# Patient Record
Sex: Female | Born: 1945 | Race: White | Hispanic: No | Smoking: Never smoker
Health system: Southern US, Community
[De-identification: ages and names within clinical notes are randomized; demographics above are authoritative.]

## PROBLEM LIST (undated history)

## (undated) DIAGNOSIS — I10 Essential (primary) hypertension: Secondary | ICD-10-CM

## (undated) DIAGNOSIS — I272 Pulmonary hypertension, unspecified: Secondary | ICD-10-CM

## (undated) DIAGNOSIS — E119 Type 2 diabetes mellitus without complications: Secondary | ICD-10-CM

## (undated) DIAGNOSIS — I4891 Unspecified atrial fibrillation: Secondary | ICD-10-CM

## (undated) HISTORY — PX: CARDIAC SURGERY: SHX584

---

## 2015-04-06 ENCOUNTER — Encounter (HOSPITAL_COMMUNITY): Payer: Self-pay

## 2015-04-06 ENCOUNTER — Emergency Department (HOSPITAL_COMMUNITY): Payer: Medicare Other

## 2015-04-06 ENCOUNTER — Inpatient Hospital Stay (HOSPITAL_COMMUNITY)
Admission: EM | Admit: 2015-04-06 | Discharge: 2015-04-08 | DRG: 193 | Disposition: A | Discharge status: Home / Self Care | Payer: Medicare Other | Attending: Internal Medicine | Admitting: Internal Medicine

## 2015-04-06 DIAGNOSIS — I272 Other secondary pulmonary hypertension: Secondary | ICD-10-CM

## 2015-04-06 DIAGNOSIS — J189 Pneumonia, unspecified organism: Secondary | ICD-10-CM | POA: Diagnosis not present

## 2015-04-06 DIAGNOSIS — I482 Chronic atrial fibrillation, unspecified: Secondary | ICD-10-CM | POA: Diagnosis present

## 2015-04-06 DIAGNOSIS — Z6841 Body Mass Index (BMI) 40.0 and over, adult: Secondary | ICD-10-CM | POA: Diagnosis not present

## 2015-04-06 DIAGNOSIS — G8929 Other chronic pain: Secondary | ICD-10-CM | POA: Diagnosis present

## 2015-04-06 DIAGNOSIS — Z79899 Other long term (current) drug therapy: Secondary | ICD-10-CM

## 2015-04-06 DIAGNOSIS — Z880 Allergy status to penicillin: Secondary | ICD-10-CM | POA: Diagnosis not present

## 2015-04-06 DIAGNOSIS — Z7984 Long term (current) use of oral hypoglycemic drugs: Secondary | ICD-10-CM

## 2015-04-06 DIAGNOSIS — M549 Dorsalgia, unspecified: Secondary | ICD-10-CM | POA: Diagnosis present

## 2015-04-06 DIAGNOSIS — Z7901 Long term (current) use of anticoagulants: Secondary | ICD-10-CM

## 2015-04-06 DIAGNOSIS — R791 Abnormal coagulation profile: Secondary | ICD-10-CM

## 2015-04-06 DIAGNOSIS — E039 Hypothyroidism, unspecified: Secondary | ICD-10-CM | POA: Diagnosis present

## 2015-04-06 DIAGNOSIS — I10 Essential (primary) hypertension: Secondary | ICD-10-CM | POA: Diagnosis present

## 2015-04-06 DIAGNOSIS — E669 Obesity, unspecified: Secondary | ICD-10-CM | POA: Diagnosis present

## 2015-04-06 DIAGNOSIS — J9601 Acute respiratory failure with hypoxia: Secondary | ICD-10-CM | POA: Diagnosis present

## 2015-04-06 DIAGNOSIS — E119 Type 2 diabetes mellitus without complications: Secondary | ICD-10-CM | POA: Diagnosis present

## 2015-04-06 DIAGNOSIS — N179 Acute kidney failure, unspecified: Secondary | ICD-10-CM | POA: Diagnosis present

## 2015-04-06 DIAGNOSIS — I480 Paroxysmal atrial fibrillation: Secondary | ICD-10-CM

## 2015-04-06 DIAGNOSIS — I959 Hypotension, unspecified: Secondary | ICD-10-CM | POA: Diagnosis present

## 2015-04-06 DIAGNOSIS — R0602 Shortness of breath: Secondary | ICD-10-CM | POA: Diagnosis present

## 2015-04-06 HISTORY — DX: Unspecified atrial fibrillation: I48.91

## 2015-04-06 HISTORY — DX: Pulmonary hypertension, unspecified: I27.20

## 2015-04-06 HISTORY — DX: Essential (primary) hypertension: I10

## 2015-04-06 HISTORY — DX: Type 2 diabetes mellitus without complications: E11.9

## 2015-04-06 LAB — COMPREHENSIVE METABOLIC PANEL
ALBUMIN: 3.5 g/dL (ref 3.5–5.0)
ALK PHOS: 58 U/L (ref 38–126)
ALT: 21 U/L (ref 14–54)
AST: 25 U/L (ref 15–41)
Anion gap: 10 (ref 5–15)
BILIRUBIN TOTAL: 0.4 mg/dL (ref 0.3–1.2)
BUN: 29 mg/dL — AB (ref 6–20)
CALCIUM: 9 mg/dL (ref 8.9–10.3)
CO2: 29 mmol/L (ref 22–32)
CREATININE: 1.41 mg/dL — AB (ref 0.44–1.00)
Chloride: 91 mmol/L — ABNORMAL LOW (ref 101–111)
GFR calc Af Amer: 43 mL/min — ABNORMAL LOW (ref >60)
GFR, EST NON AFRICAN AMERICAN: 37 mL/min — AB (ref >60)
GLUCOSE: 217 mg/dL — AB (ref 65–99)
Potassium: 4.1 mmol/L (ref 3.5–5.1)
Sodium: 130 mmol/L — ABNORMAL LOW (ref 135–145)
TOTAL PROTEIN: 7.2 g/dL (ref 6.5–8.1)

## 2015-04-06 LAB — I-STAT CG4 LACTIC ACID, ED
Lactic Acid, Venous: 1.05 mmol/L (ref 0.5–2.0)
Lactic Acid, Venous: 0.66 mmol/L (ref 0.5–2.0)

## 2015-04-06 LAB — URINALYSIS, ROUTINE W REFLEX MICROSCOPIC
BILIRUBIN URINE: NEGATIVE
GLUCOSE, UA: NEGATIVE mg/dL
KETONES UR: NEGATIVE mg/dL
Leukocytes, UA: NEGATIVE
Nitrite: NEGATIVE
PROTEIN: 30 mg/dL — AB
Specific Gravity, Urine: 1.02 (ref 1.005–1.030)
pH: 5 (ref 5.0–8.0)

## 2015-04-06 LAB — LIPASE, BLOOD: Lipase: 49 U/L (ref 11–51)

## 2015-04-06 LAB — URINE MICROSCOPIC-ADD ON

## 2015-04-06 LAB — CBC
HCT: 39.4 % (ref 36.0–46.0)
Hemoglobin: 12.4 g/dL (ref 12.0–15.0)
MCH: 27 pg (ref 26.0–34.0)
MCHC: 31.5 g/dL (ref 30.0–36.0)
MCV: 85.8 fL (ref 78.0–100.0)
PLATELETS: 168 10*3/uL (ref 150–400)
RBC: 4.59 MIL/uL (ref 3.87–5.11)
RDW: 13.5 % (ref 11.5–15.5)
WBC: 8.2 10*3/uL (ref 4.0–10.5)

## 2015-04-06 LAB — GLUCOSE, CAPILLARY: GLUCOSE-CAPILLARY: 215 mg/dL — AB (ref 65–99)

## 2015-04-06 LAB — I-STAT TROPONIN, ED: Troponin i, poc: 0.01 ng/mL (ref 0.00–0.08)

## 2015-04-06 LAB — PROTIME-INR
INR: 4.24 — ABNORMAL HIGH (ref 0.00–1.49)
PROTHROMBIN TIME: 38.5 s — AB (ref 11.6–15.2)

## 2015-04-06 MED ORDER — SILDENAFIL CITRATE 20 MG PO TABS
20.0000 mg | ORAL_TABLET | Freq: Three times a day (TID) | ORAL | Status: DC
  Administered 2015-04-06 – 2015-04-08 (×6): 20 mg via ORAL
  Filled 2015-04-06 (×7): qty 1

## 2015-04-06 MED ORDER — INSULIN ASPART 100 UNIT/ML ~~LOC~~ SOLN
0.0000 [IU] | SUBCUTANEOUS | Status: DC
  Administered 2015-04-06: 3 [IU] via SUBCUTANEOUS
  Administered 2015-04-07: 2 [IU] via SUBCUTANEOUS
  Administered 2015-04-07: 1 [IU] via SUBCUTANEOUS

## 2015-04-06 MED ORDER — ALUM & MAG HYDROXIDE-SIMETH 200-200-20 MG/5ML PO SUSP: 30.0000 mL | Freq: Four times a day (QID) | ORAL | Status: DC | PRN

## 2015-04-06 MED ORDER — OMEGA-3-ACID ETHYL ESTERS 1 G PO CAPS
1.0000 g | ORAL_CAPSULE | Freq: Every day | ORAL | Status: DC
  Administered 2015-04-07 – 2015-04-08 (×2): 1 g via ORAL
  Filled 2015-04-06 (×2): qty 1

## 2015-04-06 MED ORDER — SODIUM CHLORIDE 0.9% FLUSH
3.0000 mL | Freq: Two times a day (BID) | INTRAVENOUS | Status: DC
  Administered 2015-04-06 – 2015-04-08 (×3): 3 mL via INTRAVENOUS

## 2015-04-06 MED ORDER — ONDANSETRON HCL 4 MG PO TABS: 4.0000 mg | ORAL_TABLET | Freq: Four times a day (QID) | ORAL | Status: DC | PRN

## 2015-04-06 MED ORDER — ONDANSETRON HCL 4 MG/2ML IJ SOLN: 4.0000 mg | Freq: Four times a day (QID) | INTRAMUSCULAR | Status: DC | PRN

## 2015-04-06 MED ORDER — THYROID 60 MG PO TABS
60.0000 mg | ORAL_TABLET | Freq: Every day | ORAL | Status: DC
  Administered 2015-04-07 – 2015-04-08 (×2): 60 mg via ORAL
  Filled 2015-04-06 (×3): qty 1

## 2015-04-06 MED ORDER — SPIRONOLACTONE 25 MG PO TABS
12.5000 mg | ORAL_TABLET | Freq: Every day | ORAL | Status: DC
  Administered 2015-04-07: 12.5 mg via ORAL
  Filled 2015-04-06: qty 1

## 2015-04-06 MED ORDER — SODIUM CHLORIDE 0.9 % IV SOLN
INTRAVENOUS | Status: DC
  Administered 2015-04-06: 21:00:00 via INTRAVENOUS

## 2015-04-06 MED ORDER — ACETAMINOPHEN 325 MG PO TABS
650.0000 mg | ORAL_TABLET | Freq: Once | ORAL | Status: DC | PRN
  Filled 2015-04-06: qty 2

## 2015-04-06 MED ORDER — ACETAMINOPHEN 650 MG RE SUPP: 650.0000 mg | Freq: Four times a day (QID) | RECTAL | Status: DC | PRN

## 2015-04-06 MED ORDER — LEVOFLOXACIN IN D5W 750 MG/150ML IV SOLN
750.0000 mg | Freq: Once | INTRAVENOUS | Status: AC
  Administered 2015-04-06: 750 mg via INTRAVENOUS
  Filled 2015-04-06: qty 150

## 2015-04-06 MED ORDER — VANCOMYCIN HCL IN DEXTROSE 1-5 GM/200ML-% IV SOLN: 1000.0000 mg | Freq: Once | INTRAVENOUS | Status: DC

## 2015-04-06 MED ORDER — METOPROLOL TARTRATE 25 MG PO TABS
12.5000 mg | ORAL_TABLET | Freq: Two times a day (BID) | ORAL | Status: DC
  Administered 2015-04-06 – 2015-04-08 (×4): 12.5 mg via ORAL
  Filled 2015-04-06 (×4): qty 1

## 2015-04-06 MED ORDER — ACETAMINOPHEN 325 MG PO TABS
650.0000 mg | ORAL_TABLET | Freq: Four times a day (QID) | ORAL | Status: DC | PRN
  Administered 2015-04-06 – 2015-04-07 (×2): 650 mg via ORAL
  Filled 2015-04-06 (×2): qty 2

## 2015-04-06 MED ORDER — OXYCODONE HCL 5 MG PO TABS
5.0000 mg | ORAL_TABLET | ORAL | Status: DC | PRN
  Administered 2015-04-07: 5 mg via ORAL
  Filled 2015-04-06: qty 1

## 2015-04-06 MED ORDER — LEVOFLOXACIN IN D5W 750 MG/150ML IV SOLN: 750.0000 mg | INTRAVENOUS | Status: DC

## 2015-04-06 MED ORDER — SODIUM CHLORIDE 0.9 % IV BOLUS (SEPSIS)
1000.0000 mL | INTRAVENOUS | Status: DC
  Administered 2015-04-06: 1000 mL via INTRAVENOUS

## 2015-04-06 MED ORDER — VANCOMYCIN HCL IN DEXTROSE 750-5 MG/150ML-% IV SOLN: 750.0000 mg | Freq: Two times a day (BID) | INTRAVENOUS | Status: DC

## 2015-04-06 MED ORDER — HYDROMORPHONE HCL 1 MG/ML IJ SOLN: 0.5000 mg | INTRAMUSCULAR | Status: DC | PRN

## 2015-04-06 NOTE — Progress Notes (Signed)
Pharmacy Antibiotic Note  Kelsey Stephens is a 70 y.o. female admitted on 04/06/2015 with sepsis.  C/o chest pain, fever and shortness of breath. Allergic to penicillins. Pharmacy has been consulted for Vancomycin and Levaquin dosing. First doses ordered in the ED. CrCl 39-39ml/min.  Plan: Vancomycin  IV q12h. Levaquin   IV q48h. Measure Vanc trough at steady state. Follow up renal fxn, culture results, and clinical course.  Height: 5' (152.4 cm) Weight: 220 lb (99.791 kg) IBW/kg (Calculated) : 45.5  Temp (24hrs), Avg:102.9 F (39.4 C), Min:102.9 F (39.4 C), Max:102.9 F (39.4 C)   Recent Labs Lab 04/06/15 1644 04/06/15 1646 04/06/15 1657  WBC 8.2  --   --   CREATININE  --  1.41*  --   LATICACIDVEN  --   --  1.05    Estimated Creatinine Clearance: 39.4 mL/min (by C-G formula based on Cr of 1.41).    Allergies  Allergen Reactions  . Ampicillin Rash    Antimicrobials this admission: 2/8 >> Vanc >>  2/8 >> Levaquin >>  Dose adjustments this admission:   Microbiology results: BCx: UCx:  Sputum: MRSA PCR:  Thank you for allowing pharmacy to be a part of this patient's care.  Charolotte Eke, PharmD, pager [phone]. 04/06/2015,5:31 PM.

## 2015-04-06 NOTE — ED Provider Notes (Signed)
CSN: [id number]     Arrival date & time 04/06/15  1605 History   First MD Initiated Contact with Patient 04/06/15 1633     Chief Complaint  Patient presents with  . Chest Pain  . Fever  . Shortness of Breath    HPI Pt started having trouble with chest pain and aching on the right side.  She also started feeling lightheaded and chilled.   The symptoms got a little better but then she started feeling worse.  She developed some abdominal cramping and diarrhea but that has resolved. She was planning a trip to [facility] from [place] and recently drove here this week.  Today she started having a fever. .  The pain in the chest continues.  It hurts for her to breathe and when she takes a deep breath she coughs.  She did not have a flu shot this year.  Past Medical History  Diagnosis Date  . Hypertension   . Atrial fibrillation (HCC)   . Diabetes mellitus without complication (HCC)   . Pulmonary hypertension [facility])    Past Surgical History  Procedure Laterality Date  . Cardiac surgery     History reviewed. No pertinent family history. Social History  Substance Use Topics  . Smoking status: Never Smoker   . Smokeless tobacco: None  . Alcohol Use: No   OB History    No data available     Review of Systems  All other systems reviewed and are negative.     Allergies  Ampicillin  Home Medications   Prior to Admission medications   Medication Sig Start Date End Date Taking? Authorizing Provider  Cholecalciferol (VITAMIN D-3) 1000 units CAPS Take 5,000 Units by mouth daily.    Yes Historical Provider, MD  furosemide (LASIX) 20 MG tablet Take 20 mg by mouth 2 (two) times daily.  03/23/15  Yes Historical Provider, MD  metFORMIN (GLUCOPHAGE) 500 MG tablet Take 500 mg by mouth 3 (three) times daily.    Yes Historical Provider, MD  metoprolol tartrate (LOPRESSOR) 25 MG tablet Take 12.5 mg by mouth 2 (two) times daily.    Yes Historical Provider, MD  Omega-3 Fatty Acids (FISH OIL) 1000 MG CAPS  Take 1 capsule by mouth daily.    Yes Historical Provider, MD  sildenafil (REVATIO) 20 MG tablet Take 20 mg by mouth 3 (three) times daily.  03/22/15  Yes Historical Provider, MD  spironolactone (ALDACTONE) 25 MG tablet Take 12.5 mg by mouth daily.   Yes Historical Provider, MD  thyroid (ARMOUR THYROID) 60 MG tablet Take 60 mg by mouth daily before breakfast.    Yes Historical Provider, MD  warfarin (COUMADIN) 5 MG tablet Take 5-7.5 mg by mouth at bedtime. Take 5 mg daily except on Monday take 7.5 mg.   Yes Historical Provider, MD   BP 119/70 mmHg  Pulse 123  Temp(Src) 99.8 F (37.7 C) (Oral)  Resp 27  Ht 5' (1.524 m)  Wt 99.791 kg  BMI 42.97 kg/m2  SpO2 90% Physical Exam  Constitutional: She appears well-developed and well-nourished. No distress.  HENT:  Head: Normocephalic and atraumatic.  Right Ear: External ear normal.  Left Ear: External ear normal.  Eyes: Conjunctivae are normal. Right eye exhibits no discharge. Left eye exhibits no discharge. No scleral icterus.  Neck: Neck supple. No tracheal deviation present.  Cardiovascular: Intact distal pulses.  An irregularly irregular rhythm present. Tachycardia present.   Pulmonary/Chest: Effort normal. No stridor. No respiratory distress. She has no wheezes.  She has rales in the right lower field and the left lower field.  Abdominal: Soft. Bowel sounds are normal. She exhibits no distension. There is no tenderness. There is no rebound and no guarding.  Musculoskeletal: She exhibits no edema or tenderness.  Neurological: She is alert. She has normal strength. No cranial nerve deficit (no facial droop, extraocular movements intact, no slurred speech) or sensory deficit. She exhibits normal muscle tone. She displays no seizure activity. Coordination normal.  Skin: Skin is warm and dry. No rash noted.  Psychiatric: She has a normal mood and affect.  Nursing note and vitals reviewed.   ED Course  Procedures (including critical care  time) Labs Review Labs Reviewed  PROTIME-INR - Abnormal; Notable for the following:    Prothrombin Time 38.5 (*)    INR 4.24 (*)    All other components within normal limits  COMPREHENSIVE METABOLIC PANEL - Abnormal; Notable for the following:    Sodium 130 (*)    Chloride 91 (*)    Glucose, Bld 217 (*)    BUN 29 (*)    Creatinine, Ser 1.41 (*)    GFR calc non Af Amer 37 (*)    GFR calc Af Amer 43 (*)    All other components within normal limits  CULTURE, BLOOD (ROUTINE X 2)  CULTURE, BLOOD (ROUTINE X 2)  URINE CULTURE  CBC  LIPASE, BLOOD  URINALYSIS, ROUTINE W REFLEX MICROSCOPIC (NOT AT [facility])  INFLUENZA PANEL BY PCR (TYPE A & B, H1N1)  I-STAT TROPOININ, ED  I-STAT CG4 LACTIC ACID, ED  I-STAT CG4 LACTIC ACID, ED    Imaging Review Dg Chest 2 View  04/06/2015  CLINICAL DATA:  Fever, shortness of breath, cough, chest discomfort EXAM: CHEST  2 VIEW COMPARISON:  None. FINDINGS: Mild patchy right lower lung opacity, overlying the heart on the lateral view, suspicious for right middle lobe pneumonia. In the absence of prior imaging, chronic scarring is also possible. No pleural effusion or pneumothorax. Cardiomegaly.  Median sternotomy. Degenerative changes of the visualized thoracolumbar spine. IMPRESSION: Mild patchy right middle lobe opacity, suspicious for pneumonia, although chronic scarring is also possible. Electronically Signed   By: Charline Bills M.D.   On: 04/06/2015 17:29   I have personally reviewed and evaluated these images and lab results as part of my medical decision-making.   EKG Interpretation   Date/Time:  Wednesday April 06 2015 16:30:39 EST Ventricular Rate:  111 PR Interval:    QRS Duration: 78 QT Interval:  301 QTC Calculation: 409 R Axis:   104 Text Interpretation:  Atrial fibrillation Right axis deviation Low  voltage, precordial leads Probable anteroseptal infarct, old No old  tracing to compare Confirmed by Jermesha Sottile  MD-J, Teran Daughenbaugh ([id number]) on 04/06/2015   5:40:51 PM     Medications  acetaminophen (TYLENOL) tablet 650 mg (not administered)  sodium chloride 0.9 % bolus 1,000 mL (1,000 mLs Intravenous New Bag/Given 04/06/15 1759)  levofloxacin (LEVAQUIN) IVPB 750 mg (750 mg Intravenous New Bag/Given 04/06/15 1759)  vancomycin (VANCOCIN) IVPB 1000 mg/200 mL premix (not administered)  levofloxacin (LEVAQUIN) IVPB 750 mg (not administered)  vancomycin (VANCOCIN) IVPB 750 mg/150 ml premix (not administered)    MDM   Final diagnoses:  CAP (community acquired pneumonia)    Patient's chest x-ray is suggestive of a right middle lobe pneumonia. The patient remains tachycardic and tachypnea. She is mildly hypoxic.  Laboratory tests are notable for mild renal sufficiency and hypokalemia although she has a normal lactic acid level.  He does not have evidence of severe sepsis or septic shock. I do think she would benefit from hospitalization (PORT score 110) considering her persistent tachycardia and tachypnea and her laboratory abnormalities.      Linwood Dibbles, MD 04/06/15 [phone]

## 2015-04-06 NOTE — ED Notes (Addendum)
Pt c/o generalized chest pressure w/ deep breathing, SOB, chills, lightheadedness, and emesis x 5 days and abdominal pain and diarrhea x "a couple days."  Pain score 4/10.  Hx of A fib, HTN and diabetes.  Pt reports she recently travelled by car from [place] and arrived today.

## 2015-04-06 NOTE — H&P (Addendum)
[facility] Admission History and Physical       Khrystal Jeanmarie NWG:[id number] DOB: [birth year] DOA: 04/06/2015  Referring physician: EDP PCP: No primary care provider on file.  Specialists:   Chief Complaint: SOB and Cough and Fever  HPI: Kelsey Stephens is a 70 y.o. female with a history of Atrial Fibrillation on Coumadin Rx, PAH, HTN, DM2 and Hypothyroid who presents to the ED with complaints of worsening SOB, Cough, and Fevers and Chills over a 1 week period.   She was traveling by car to [place] from [place] to visit family and was brought to the ED today.   She was evaluated in the ED for Sepsis initially then she was found to have a RML infiltrate and was referred for admission for a CAP Pneumonia.    She was placed on IV Levaquin .     Review of Systems:  Constitutional: No Weight Loss, No Weight Gain, Night Sweats, +Fevers, +Chills, Dizziness, Light Headedness, Fatigue, or Generalized Weakness HEENT: No Headaches, Difficulty Swallowing,Tooth/Dental Problems,Sore Throat,  No Sneezing, Rhinitis, Ear Ache, Nasal Congestion, or Post Nasal Drip,  Cardio-vascular:  No Chest pain, Orthopnea, PND, Edema in Lower Extremities, Anasarca, Dizziness, Palpitations  Resp:  +Dyspnea, No DOE, No Productive Cough, +Non-Productive Cough, No Hemoptysis, No Wheezing.    GI: No Heartburn, Indigestion, Abdominal Pain, Nausea, Vomiting, Diarrhea, Constipation, Hematemesis, Hematochezia, Melena, Change in Bowel Habits,  Loss of Appetite  GU: No Dysuria, No Change in Color of Urine, No Urgency or Urinary Frequency, No Flank pain.  Musculoskeletal: No Joint Pain or Swelling, No Decreased Range of Motion, No Back Pain.  Neurologic: No Syncope, No Seizures, Muscle Weakness, Paresthesia, Vision Disturbance or Loss, No Diplopia, No Vertigo, No Difficulty Walking,  Skin: No Rash or Lesions. Psych: No Change in Mood or Affect, No Depression or Anxiety, No Memory loss, No Confusion, or Hallucinations   Past  Medical History  Diagnosis Date  . Hypertension   . Atrial fibrillation (HCC)   . Diabetes mellitus without complication (HCC)   . Pulmonary hypertension [facility])      Past Surgical History  Procedure Laterality Date  . Cardiac surgery        Prior to Admission medications   Medication Sig Start Date End Date Taking? Authorizing Provider  Cholecalciferol (VITAMIN D-3) 1000 units CAPS Take 5,000 Units by mouth daily.    Yes Historical Provider, MD  furosemide (LASIX) 20 MG tablet Take 20 mg by mouth 2 (two) times daily.  03/23/15  Yes Historical Provider, MD  metFORMIN (GLUCOPHAGE) 500 MG tablet Take 500 mg by mouth 3 (three) times daily.    Yes Historical Provider, MD  metoprolol tartrate (LOPRESSOR) 25 MG tablet Take 12.5 mg by mouth 2 (two) times daily.    Yes Historical Provider, MD  Omega-3 Fatty Acids (FISH OIL) 1000 MG CAPS Take 1 capsule by mouth daily.    Yes Historical Provider, MD  sildenafil (REVATIO) 20 MG tablet Take 20 mg by mouth 3 (three) times daily.  03/22/15  Yes Historical Provider, MD  spironolactone (ALDACTONE) 25 MG tablet Take 12.5 mg by mouth daily.   Yes Historical Provider, MD  thyroid (ARMOUR THYROID) 60 MG tablet Take 60 mg by mouth daily before breakfast.    Yes Historical Provider, MD  warfarin (COUMADIN) 5 MG tablet Take 5-7.5 mg by mouth at bedtime. Take 5 mg daily except on Monday take 7.5 mg.   Yes Historical Provider, MD     Allergies  Allergen Reactions  .  Ampicillin Rash     Social History:  reports that she has never smoked. She does not have any smokeless tobacco history on file. She reports that she does not drink alcohol. Her drug history is not on file.      History reviewed. No pertinent family history.     Physical Exam:  GEN:  Pleasant Obese Elderly 70 y.o. Caucasian female examined and in no acute distress; cooperative with exam Filed Vitals:   04/06/15 1624 04/06/15 1655 04/06/15 1733 04/06/15 1759  BP: 136/81  119/70 119/70    Pulse: 122  120 123  Temp: 102.9 F (39.4 C)   99.8 F (37.7 C)  TempSrc: Oral   Oral  Resp: 20  34 27  Height:  5' (1.524 m)    Weight:  99.791 kg (220 lb)    SpO2: 92%  90% 90%   Blood pressure 119/70, pulse 123, temperature 99.8 F (37.7 C), temperature source Oral, resp. rate 27, height 5' (1.524 m), weight 99.791 kg (220 lb), SpO2 90 %. PSYCH: She is alert and oriented x4; does not appear anxious does not appear depressed; affect is normal HEENT: Normocephalic and Atraumatic, Mucous membranes pink; PERRLA; EOM intact; Fundi:  Benign;  No scleral icterus, Nares: Patent, Oropharynx: Clear, Fair Dentition,    Neck:  FROM, No Cervical Lymphadenopathy nor Thyromegaly or Carotid Bruit; No JVD; Breasts:: Not examined CHEST WALL: No tenderness CHEST: Normal respiration, clear to auscultation bilaterally HEART: Irregular Irregular rhythm; no murmurs rubs or gallops BACK: No kyphosis or scoliosis; No CVA tenderness ABDOMEN: Positive Bowel Sounds, Obese, Soft Non-Tender, No Rebound or Guarding; No Masses, No Organomegaly Rectal Exam: Not done EXTREMITIES: No Cyanosis, Clubbing, or Edema; No Ulcerations. Genitalia: not examined PULSES: 2+ and symmetric SKIN: Normal hydration no rash or ulceration CNS:  Alert and Oriented x 4, No Focal Deficits Vascular: pulses palpable throughout    Labs on Admission:  Basic Metabolic Panel:  Recent Labs Lab 04/06/15 1646  NA 130*  K 4.1  CL 91*  CO2 29  GLUCOSE 217*  BUN 29*  CREATININE 1.41*  CALCIUM 9.0   Liver Function Tests:  Recent Labs Lab 04/06/15 1646  AST 25  ALT 21  ALKPHOS 58  BILITOT 0.4  PROT 7.2  ALBUMIN 3.5    Recent Labs Lab 04/06/15 1644  LIPASE 49   No results for input(s): AMMONIA in the last 168 hours. CBC:  Recent Labs Lab 04/06/15 1644  WBC 8.2  HGB 12.4  HCT 39.4  MCV 85.8  PLT 168   Cardiac Enzymes: No results for input(s): CKTOTAL, CKMB, CKMBINDEX, TROPONINI in the last 168 hours.  BNP  (last 3 results) No results for input(s): BNP in the last 8760 hours.  ProBNP (last 3 results) No results for input(s): PROBNP in the last 8760 hours.  CBG: No results for input(s): GLUCAP in the last 168 hours.  Radiological Exams on Admission: Dg Chest 2 View  04/06/2015  CLINICAL DATA:  Fever, shortness of breath, cough, chest discomfort EXAM: CHEST  2 VIEW COMPARISON:  None. FINDINGS: Mild patchy right lower lung opacity, overlying the heart on the lateral view, suspicious for right middle lobe pneumonia. In the absence of prior imaging, chronic scarring is also possible. No pleural effusion or pneumothorax. Cardiomegaly.  Median sternotomy. Degenerative changes of the visualized thoracolumbar spine. IMPRESSION: Mild patchy right middle lobe opacity, suspicious for pneumonia, although chronic scarring is also possible. Electronically Signed   By: Roselie Awkward.D.  On: 04/06/2015 17:29     EKG: Independently reviewed.    Assessment/Plan:   70 y.o. female with  Principal Problem:    1.   CAP (community acquired pneumonia)    IV Levaquin    Albuterol Nebs PRN    O2 PRN       Active Problems:    2.   Atrial fibrillation (HCC)    Continue Lopressor or as BP and HR tolerate    Continue Coumadin with Pharmacy adjustment PRN     Daily PT/INRs    3.   AKI   Hold Lasix   Gentle IVFs   Monitor BUN/Cr      4.   Hypertension    Continue Lopressor and Aldactone Rx    Hold Lasix Rx due to #3    Monitor BMET      4.   Pulmonary hypertension (HCC)    Continue Revatio Rx    Patient may take her Home dose of Opsumit qhs daily      5.   Diabetes mellitus without complication (HCC)    Hold Metformin Rx    SSI coverage PRN    Check HbA1C      6.   DVT Prophylaxis    On Coumadin Rx          Code Status:     FULL CODE      Family Communication:   Family at Bedside   Disposition Plan:    Inpatient Status Expected 2-3 days LOS         Time spent:  11 Minutes       Ron Parker [facility] Pager [phone]   If 7AM -7PM Please Contact the Day Rounding Team MD for [facility]  If 7PM-7AM, Please Contact Night-Floor Coverage  [email or web address] Password [facility] 04/06/2015, 6:49 PM     ADDENDUM:   Patient was seen and examined on 04/06/2015

## 2015-04-07 DIAGNOSIS — N179 Acute kidney failure, unspecified: Secondary | ICD-10-CM

## 2015-04-07 LAB — INFLUENZA PANEL BY PCR (TYPE A & B)
H1N1FLUPCR: NOT DETECTED
Influenza A By PCR: NEGATIVE
Influenza B By PCR: NEGATIVE

## 2015-04-07 LAB — CBC
HEMATOCRIT: 34.9 % — AB (ref 36.0–46.0)
HEMOGLOBIN: 10.9 g/dL — AB (ref 12.0–15.0)
MCH: 26.8 pg (ref 26.0–34.0)
MCHC: 31.2 g/dL (ref 30.0–36.0)
MCV: 86 fL (ref 78.0–100.0)
Platelets: 166 10*3/uL (ref 150–400)
RBC: 4.06 MIL/uL (ref 3.87–5.11)
RDW: 13.4 % (ref 11.5–15.5)
WBC: 7.5 10*3/uL (ref 4.0–10.5)

## 2015-04-07 LAB — GLUCOSE, CAPILLARY
GLUCOSE-CAPILLARY: 104 mg/dL — AB (ref 65–99)
GLUCOSE-CAPILLARY: 143 mg/dL — AB (ref 65–99)
GLUCOSE-CAPILLARY: 151 mg/dL — AB (ref 65–99)
GLUCOSE-CAPILLARY: 163 mg/dL — AB (ref 65–99)
GLUCOSE-CAPILLARY: 179 mg/dL — AB (ref 65–99)
Glucose-Capillary: 107 mg/dL — ABNORMAL HIGH (ref 65–99)

## 2015-04-07 LAB — PROTIME-INR
INR: 5.61 (ref 0.00–1.49)
PROTHROMBIN TIME: 47.5 s — AB (ref 11.6–15.2)

## 2015-04-07 LAB — BASIC METABOLIC PANEL
ANION GAP: 7 (ref 5–15)
BUN: 25 mg/dL — AB (ref 6–20)
CALCIUM: 8.5 mg/dL — AB (ref 8.9–10.3)
CO2: 28 mmol/L (ref 22–32)
Chloride: 100 mmol/L — ABNORMAL LOW (ref 101–111)
Creatinine, Ser: 1.26 mg/dL — ABNORMAL HIGH (ref 0.44–1.00)
GFR calc Af Amer: 49 mL/min — ABNORMAL LOW (ref >60)
GFR calc non Af Amer: 42 mL/min — ABNORMAL LOW (ref >60)
GLUCOSE: 132 mg/dL — AB (ref 65–99)
Potassium: 3.9 mmol/L (ref 3.5–5.1)
Sodium: 135 mmol/L (ref 135–145)

## 2015-04-07 LAB — STREP PNEUMONIAE URINARY ANTIGEN: STREP PNEUMO URINARY ANTIGEN: NEGATIVE

## 2015-04-07 MED ORDER — DEXTROSE 5 % IV SOLN
1.0000 g | INTRAVENOUS | Status: DC
  Administered 2015-04-07 – 2015-04-08 (×2): 1 g via INTRAVENOUS
  Filled 2015-04-07 (×2): qty 10

## 2015-04-07 MED ORDER — DIPHENHYDRAMINE HCL 25 MG PO CAPS: 25.0000 mg | ORAL_CAPSULE | Freq: Four times a day (QID) | ORAL | Status: DC | PRN

## 2015-04-07 MED ORDER — INSULIN ASPART 100 UNIT/ML ~~LOC~~ SOLN
0.0000 [IU] | Freq: Three times a day (TID) | SUBCUTANEOUS | Status: DC
  Administered 2015-04-08 (×2): 2 [IU] via SUBCUTANEOUS

## 2015-04-07 MED ORDER — PRO-STAT SUGAR FREE PO LIQD
30.0000 mL | Freq: Every day | ORAL | Status: DC
  Administered 2015-04-07 – 2015-04-08 (×2): 30 mL via ORAL
  Filled 2015-04-07 (×2): qty 30

## 2015-04-07 MED ORDER — PHYTONADIONE 5 MG PO TABS
2.5000 mg | ORAL_TABLET | Freq: Once | ORAL | Status: AC
  Administered 2015-04-07: 2.5 mg via ORAL
  Filled 2015-04-07: qty 1

## 2015-04-07 MED ORDER — MUSCLE RUB 10-15 % EX CREA
1.0000 "application " | TOPICAL_CREAM | CUTANEOUS | Status: DC | PRN
  Administered 2015-04-07: 1 via TOPICAL
  Filled 2015-04-07: qty 85

## 2015-04-07 MED ORDER — OXYCODONE HCL 5 MG PO TABS
5.0000 mg | ORAL_TABLET | ORAL | Status: DC | PRN
  Administered 2015-04-07 – 2015-04-08 (×3): 5 mg via ORAL
  Filled 2015-04-07 (×3): qty 1

## 2015-04-07 NOTE — Progress Notes (Signed)
Initial Nutrition Assessment  DOCUMENTATION CODES:   Morbid obesity  INTERVENTION:  - Will order 30 mL Prostat once/day, this supplement provides 100 kcal and 15 grams of protein. - Continue to encourage PO intakes of meals - RD will continue to monitor for needs  NUTRITION DIAGNOSIS:   Inadequate oral intake related to acute illness, nausea, vomiting as evidenced by per patient/family report.  GOAL:   Patient will meet greater than or equal to 90% of their needs  MONITOR:   PO intake, Weight trends, Labs, I & O's  REASON FOR ASSESSMENT:   Malnutrition Screening Tool  ASSESSMENT:   70 y.o. female with a history of Atrial Fibrillation on Coumadin Rx, PAH, HTN, DM2 and Hypothyroid who presents to the ED with complaints of worsening SOB, Cough, and Fevers and Chills over a 1 week period. She was traveling by car to [place] from [place] to visit family and was brought to the ED today. She was evaluated in the ED for Sepsis initially then she was found to have a RML infiltrate and was referred for admission for a CAP Pneumonia.  Pt seen for MST. BMI indicates morbid obesity. Per chart review, pt consumed 100% of lunch today. Pt reports that for breakfast she consumed ~50% of an egg white omelet, breakfast potatoes, orange juice, and milk. Pt reports that she is very exhausted today due to lack of sleeping last night due to sounds present throughout the night.   Pt states that on Saturday (2/4) she ate scrambled eggs for dinner after work. She states that on 2/4 she began to feel lethargic and weak but after several hours of sleeping felt better. She states that on Monday (2/6) she left [place] to drive to [place] and has felt progressively worse since that time. She states minimal solid food over the past 3 days but that she was frequently drinking orange juice and water. Pt states that she has had diarrhea x1-2 days PTA but has not experienced any episodes today. She states  nausea and associated vomiting over the past few days.   Pt states that her UBW is 217 lbs but that over the past 2 months she has gained 3 lbs. Chart review indicates pt now at UBW which indicates 3 lb weight loss (1% body weight loss) over the past <1 week. No signs of muscle or fat wasting present.   She was not meeting needs for several days PTA. Will order Prostat to supplement protein intake due to increased protein needs related to PNA. Medications reviewed. Labs reviewed; CBGs: 107-215 mg/dL, Cl: 413 mmol/L, BUN/creatinine elevated, Ca: 8.5 mg/dL, GFR: 42.   Diet Order:  Diet heart healthy/carb modified Room service appropriate?: Yes; Fluid consistency:: Thin  Skin:  Reviewed, no issues  Last BM:  2/8  Height:   Ht Readings from Last 1 Encounters:  04/06/15 5' (1.524 m)    Weight:   Wt Readings from Last 1 Encounters:  04/06/15 217 lb 6.4 oz (98.612 kg)    Ideal Body Weight:  45.45 kg (kg)  BMI:  Body mass index is 42.46 kg/(m^2).  Estimated Nutritional Needs:   Kcal:  1500-1700  Protein:  85-95 grams  Fluid:  2-2.3 L/day  EDUCATION NEEDS:   No education needs identified at this time     Trenton Gammon, RD, LDN Inpatient Clinical Dietitian Pager # [phone] After hours/weekend pager # [phone]

## 2015-04-07 NOTE — Care Management Note (Signed)
Case Management Note  Patient Details  Name: Shannan Slinker MRN: [id number] Date of Birth: [birth year]  Subjective/Objective:  70 y/o f admitted w/PNA. From out of town.                  Action/Plan:d/c plan home.   Expected Discharge Date:   (unknown)               Expected Discharge Plan:  Home/Self Care  In-House Referral:     Discharge planning Services  CM Consult  Post Acute Care Choice:    Choice offered to:     DME Arranged:    DME Agency:     HH Arranged:    HH Agency:     Status of Service:  In process, will continue to follow  Medicare Important Message Given:    Date Medicare IM Given:    Medicare IM give by:    Date Additional Medicare IM Given:    Additional Medicare Important Message give by:     If discussed at Long Length of Stay Meetings, dates discussed:    Additional Comments:  Lanier Clam, RN 04/07/2015, 2:24 PM

## 2015-04-07 NOTE — Progress Notes (Signed)
[facility] PROGRESS NOTE  Chandy Tarman JXB:[id number] DOB: 1945-12-25 DOA: 04/06/2015 PCP: No primary care provider on file.  Brief Summary  Kelsey Stephens is a 70 y.o. female with a history of Atrial Fibrillation on Coumadin Rx, PAH, HTN, DM2 and Hypothyroid who presents to the ED with complaints of worsening SOB, Cough, and Fevers and Chills over a 1 week period. She was traveling by car to [place] from [place] to visit family and was brought to the ED today. She was evaluated in the ED for Sepsis initially then she was found to have a RML infiltrate and was referred for admission for a CAP Pneumonia. She was placed on IV Levaquin .   Assessment/Plan  Acute hypoxic respiratory failure secondary to community-acquired pneumonia -  Discontinue levofloxacin secondary to elevated INR -  Start doxycycline instead -  Influenza negative -  Strep pneumo antigen negative -  Attention to transition to room air if possible -  Ambulatory pulse oximetry tomorrow  Chronic atrial fibrillation, rate controlled -  CHADs2vasc = 4, needs anticoagulation and has been fairly stable on warfarin for a while -  Elevated INR -  Give 1 dose of vitamin K because patient took her dose of warfarin last night and was given a dose of levofloxacin -  No evidence of bleeding -  Pharmacy to assist with dosing of Coumadin -  We will discuss with case manager options for INR check was discharge since the patient is from [place] and has no primary care doctor here  Acute kidney injury, creatinine trending down with IV fluids  Hypertension, mild hypotension -  Discontinue Aldactone -  Agree with hold parameters for beta blocker  Pulmonary hypertension, stable, continue Revatio  Diabetes mellitus type 2, stable -  Continue SSI -  F/u A1c -  Hold metformin   Diet:  Diabetic Access:  PIV IVF:  Off Proph:  Therapeutic warfarin  Code Status: Full code Family Communication: Patient  alone Disposition Plan: Pending improvement in INR, plan for outpatient follow-up, improvement in dyspnea with ambulatory pulse ox in the morning   Consultants:  None  Procedures:  Chest x-ray  Antibiotics:  Levofloxacin 2/8, doxycycline 2/9   HPI/Subjective:  Patient states that she continues to have a cough but not much wheezing. She does not currently have fevers or chills. She has some mild pleuritic chest pain. She denies any obvious   Ob bruising or bleeding. She denies black tarry stools or blood in her stools.jectiveCeasar Mons Vitals:   04/07/15 0055 04/07/15 0605 04/07/15 0804 04/07/15 0940  BP: 93/56 103/54 110/55 109/86  Pulse: 85 88 89 104  Temp: 98.3 F (36.8 C) 98.3 F (36.8 C) 98.3 F (36.8 C)   TempSrc: Oral Oral Oral   Resp: Height:      Weight:      SpO2: 95% 98% 98% 92%    Intake/Output Summary (Last 24 hours) at 04/07/15 1825 Last data filed at 04/07/15 1700  Gross per 24 hour  Intake    240 ml  Output   1000 ml  Net   -760 ml   Filed Weights   04/06/15 1655 04/06/15 2001  Weight: 99.791 kg (220 lb) 98.612 kg (217 lb 6.4 oz)   Body mass index is 42.46 kg/(m^2).  Exam:   General:   adult female, No acute distress  HEENT:  NCAT, MMM  Cardiovascular:  RRR, nl S1, S2 no mrg, 2+ pulses, warm extremities  RespFaint wheeze, diminished  in the right upper lobe , no increased WOB  Abdomen:   NABS, soft, NT/ND  MSK:   Normal tone and bulk, no LEE  Neuro:  Grossly intact  Data Reviewed: Basic Metabolic Panel:  Recent Labs Lab 04/06/15 1646 04/07/15 0459  NA 130* 135  K 4.1 3.9  CL 91* 100*  CO2 29 28  GLUCOSE 217* 132*  BUN 29* 25*  CREATININE 1.41* 1.26*  CALCIUM 9.0 8.5*   Liver Function Tests:  Recent Labs Lab 04/06/15 1646  AST 25  ALT 21  ALKPHOS 58  BILITOT 0.4  PROT 7.2  ALBUMIN 3.5    Recent Labs Lab 04/06/15 1644  LIPASE 49   No results for input(s): AMMONIA in the last 168  hours. CBC:  Recent Labs Lab 04/06/15 1644 04/07/15 0459  WBC 8.2 7.5  HGB 12.4 10.9*  HCT 39.4 34.9*  MCV 85.8 86.0  PLT 168 166    Recent Results (from the past 240 hour(s))  Culture, blood (routine x 2)     Status: None (Preliminary result)   Collection Time: 04/06/15  4:44 PM  Result Value Ref Range Status   Specimen Description BLOOD RIGHT ANTECUBITAL  Final   Special Requests BOTTLES DRAWN AEROBIC AND ANAEROBIC 5CC  Final   Culture   Final    NO GROWTH < 24 HOURS Performed at [facility]    Report Status PENDING  Incomplete  Culture, blood (routine x 2)     Status: None (Preliminary result)   Collection Time: 04/06/15  4:44 PM  Result Value Ref Range Status   Specimen Description BLOOD LEFT FOREARM  Final   Special Requests BOTTLES DRAWN AEROBIC ONLY 5CC  Final   Culture   Final    NO GROWTH < 24 HOURS Performed at [facility]    Report Status PENDING  Incomplete  Urine culture     Status: None (Preliminary result)   Collection Time: 04/06/15  6:02 PM  Result Value Ref Range Status   Specimen Description URINE, CLEAN CATCH  Final   Special Requests NONE  Final   Culture   Final    TOO YOUNG TO READ Performed at [facility]    Report Status PENDING  Incomplete     Studies: Dg Chest 2 View  04/06/2015  CLINICAL DATA:  Fever, shortness of breath, cough, chest discomfort EXAM: CHEST  2 VIEW COMPARISON:  None. FINDINGS: Mild patchy right lower lung opacity, overlying the heart on the lateral view, suspicious for right middle lobe pneumonia. In the absence of prior imaging, chronic scarring is also possible. No pleural effusion or pneumothorax. Cardiomegaly.  Median sternotomy. Degenerative changes of the visualized thoracolumbar spine. IMPRESSION: Mild patchy right middle lobe opacity, suspicious for pneumonia, although chronic scarring is also possible. Electronically Signed   By: Charline Bills M.D.   On: 04/06/2015 17:29    Scheduled  Meds: . cefTRIAXone (ROCEPHIN)  IV  1 g Intravenous Q24H  . feeding supplement (PRO-STAT SUGAR FREE 64)  30 mL Oral Daily  . insulin aspart  0-9 Units Subcutaneous 6 times per day  . metoprolol tartrate  12.5 mg Oral BID  . omega-3 acid ethyl esters  1 g Oral Daily  . sildenafil  20 mg Oral TID  . sodium chloride flush  3 mL Intravenous Q12H  . spironolactone  12.5 mg Oral Daily  . thyroid  60 mg Oral QAC breakfast   Continuous Infusions:   Principal Problem:  CAP (community acquired pneumonia) Active Problems:   Atrial fibrillation (HCC)   Hypertension   Pulmonary hypertension (HCC)   Diabetes mellitus without complication (HCC)   AKI (acute kidney injury) (HCC)    Time spent: 30 min    Tolbert Matheson  [facility] Pager [phone]. If 7PM-7AM, please contact night-coverage at [email or web address], password [facility] 04/07/2015, 6:25 PM  LOS: 1 day

## 2015-04-07 NOTE — Clinical Documentation Improvement (Signed)
Internal Medicine  Can the diagnosis  Be provided for patient's BMI of 42.97 ? Thank you     Identify Type - morbid obesity, obesity (link the BMI to condition e.g. morbid obesity with BMI of 47), overweight, with alveolar hypoventilation (Pickwickian Syndrome)  Document etiology of - drug induced (specify drug), due to excess calories, familial, endocrine  Other  Clinically Undetermined  Document any associated diagnoses/conditions.     Please exercise your independent, professional judgment when responding. A specific answer is not anticipated or expected.   Thank You,  Lavonda Jumbo Health Information Management [place] [phone]

## 2015-04-07 NOTE — Progress Notes (Signed)
ANTICOAGULATION CONSULT NOTE - Initial Consult  Pharmacy Consult for Warfarin Indication: atrial fibrillation  Allergies  Allergen Reactions  . Ampicillin Rash    Patient Measurements: Height: 5' (152.4 cm) Weight: 217 lb 6.4 oz (98.612 kg) IBW/kg (Calculated) : 45.5  Vital Signs: Temp: 98.3 F (36.8 C) (02/09 0804) Temp Source: Oral (02/09 0804) BP: 109/86 mmHg (02/09 0940) Pulse Rate: 104 (02/09 0940)  Labs:  Recent Labs  04/06/15 1644 04/06/15 1646 04/07/15 0459  HGB 12.4  --  10.9*  HCT 39.4  --  34.9*  PLT 168  --  166  LABPROT 38.5*  --  47.5*  INR 4.24*  --  5.61*  CREATININE  --  1.41* 1.26*    Estimated Creatinine Clearance: 43.7 mL/min (by C-G formula based on Cr of 1.26).  Medical History: Past Medical History  Diagnosis Date  . Hypertension   . Atrial fibrillation (HCC)   . Diabetes mellitus without complication (HCC)   . Pulmonary hypertension [facility])     Assessment: 70 y.o. female admitted on 04/06/2015 with sepsis, CAP.  PMH also includes chronic warfarin anticoagulation for Afib.  Her INR on admission was elevated and is now increased further following Levaquin dose on 2/8.  Today, pharmacy is consulted to resume warfarin dosing when INR normalizes. Home dose is reported as  daily except 7.5mg  on Mondays, last taken on 2/7.  Today, 04/07/2015:  INR supratherapeutic and increased to 5.61  CBC: Hgb down to 10.9, Plt 166  Diet: heart healthy, carb modified.  Drug-drug interactions:  Levaquin (one dose given 2/8) will increase INR; antibiotic now changed to ceftriaxone which is less likely to cause warfarin interaction.  Vitamin K 2.5mg  PO once 2/9.    Goal of Therapy:  INR 2-3 Monitor platelets by anticoagulation protocol: Yes   Plan:   Hold warfarin today  Pharmacy to f/u daily INR  Lynann Beaver PharmD, BCPS Pager [phone] 04/07/2015 10:39 AM

## 2015-04-07 NOTE — Clinical Documentation Improvement (Signed)
Internal Medicine  Can the diagnosis of Atrial Fibrillation be further specified? Thank you    Chronic Atrial fibrillation  Paroxysmal Atrial fibrillation  Permanent Atrial fibrillation  Persistent Atrial fibrillation  Other  Clinically Undetermined  Document any associated diagnoses/conditions.     Please exercise your independent, professional judgment when responding. A specific answer is not anticipated or expected.   Thank You,  Iram Lundberg J Ameliya Nicotra Health Information Management [place] [phone] 

## 2015-04-08 ENCOUNTER — Inpatient Hospital Stay (HOSPITAL_COMMUNITY): Payer: Medicare Other

## 2015-04-08 DIAGNOSIS — E119 Type 2 diabetes mellitus without complications: Secondary | ICD-10-CM

## 2015-04-08 DIAGNOSIS — I10 Essential (primary) hypertension: Secondary | ICD-10-CM

## 2015-04-08 DIAGNOSIS — I482 Chronic atrial fibrillation: Secondary | ICD-10-CM

## 2015-04-08 DIAGNOSIS — J189 Pneumonia, unspecified organism: Principal | ICD-10-CM

## 2015-04-08 LAB — GLUCOSE, CAPILLARY
GLUCOSE-CAPILLARY: 160 mg/dL — AB (ref 65–99)
GLUCOSE-CAPILLARY: 115 mg/dL — AB (ref 65–99)
Glucose-Capillary: 193 mg/dL — ABNORMAL HIGH (ref 65–99)

## 2015-04-08 LAB — BASIC METABOLIC PANEL
ANION GAP: 8 (ref 5–15)
BUN: 24 mg/dL — ABNORMAL HIGH (ref 6–20)
CHLORIDE: 100 mmol/L — AB (ref 101–111)
CO2: 28 mmol/L (ref 22–32)
Calcium: 9.3 mg/dL (ref 8.9–10.3)
Creatinine, Ser: 1.17 mg/dL — ABNORMAL HIGH (ref 0.44–1.00)
GFR calc non Af Amer: 46 mL/min — ABNORMAL LOW (ref >60)
GFR, EST AFRICAN AMERICAN: 53 mL/min — AB (ref >60)
Glucose, Bld: 190 mg/dL — ABNORMAL HIGH (ref 65–99)
Potassium: 4.4 mmol/L (ref 3.5–5.1)
Sodium: 136 mmol/L (ref 135–145)

## 2015-04-08 LAB — URINE CULTURE

## 2015-04-08 LAB — CBC
HCT: 36.4 % (ref 36.0–46.0)
HEMOGLOBIN: 11.2 g/dL — AB (ref 12.0–15.0)
MCH: 26.9 pg (ref 26.0–34.0)
MCHC: 30.8 g/dL (ref 30.0–36.0)
MCV: 87.5 fL (ref 78.0–100.0)
Platelets: 180 10*3/uL (ref 150–400)
RBC: 4.16 MIL/uL (ref 3.87–5.11)
RDW: 13.5 % (ref 11.5–15.5)
WBC: 7 10*3/uL (ref 4.0–10.5)

## 2015-04-08 LAB — PROTIME-INR
INR: 2.76 — ABNORMAL HIGH (ref 0.00–1.49)
Prothrombin Time: 27.9 seconds — ABNORMAL HIGH (ref 11.6–15.2)

## 2015-04-08 LAB — LEGIONELLA ANTIGEN, URINE

## 2015-04-08 LAB — HEMOGLOBIN A1C
Hgb A1c MFr Bld: 7 % — ABNORMAL HIGH (ref 4.8–5.6)
Mean Plasma Glucose: 154 mg/dL

## 2015-04-08 MED ORDER — WARFARIN - PHARMACIST DOSING INPATIENT: Freq: Every day | Status: DC

## 2015-04-08 MED ORDER — CEFUROXIME AXETIL 500 MG PO TABS: 500.0000 mg | ORAL_TABLET | Freq: Two times a day (BID) | ORAL | Status: AC

## 2015-04-08 MED ORDER — WARFARIN SODIUM 5 MG PO TABS
5.0000 mg | ORAL_TABLET | Freq: Once | ORAL | Status: AC
  Administered 2015-04-08: 5 mg via ORAL
  Filled 2015-04-08: qty 1

## 2015-04-08 MED ORDER — MUSCLE RUB 10-15 % EX CREA: 1.0000 "application " | TOPICAL_CREAM | CUTANEOUS | Status: AC | PRN

## 2015-04-08 MED ORDER — CETYLPYRIDINIUM CHLORIDE 0.05 % MT LIQD
7.0000 mL | Freq: Two times a day (BID) | OROMUCOSAL | Status: DC
  Administered 2015-04-08: 7 mL via OROMUCOSAL

## 2015-04-08 MED ORDER — OXYCODONE-ACETAMINOPHEN 5-325 MG PO TABS: 1.0000 | ORAL_TABLET | ORAL | Status: AC | PRN

## 2015-04-08 NOTE — Discharge Summary (Addendum)
Physician Discharge Summary  Takasha Vetere ZOX:[id number] DOB: [birth year] DOA: 04/06/2015  PCP: No primary care provider on file.  Admit date: 04/06/2015 Discharge date: 04/08/2015  Recommendations for Outpatient Follow-up:  1. F/u for INR check on Monday or Tuesday in [place] 2. If back pain not improving, recommend follow up with PCP for additional testing  Discharge Diagnoses:  Principal Problem:   CAP (community acquired pneumonia) Active Problems:   Chronic atrial fibrillation (HCC)   Hypertension   Pulmonary hypertension (HCC)   Diabetes mellitus without complication (HCC)   AKI (acute kidney injury) (HCC)   Discharge Condition: stable, improved  Diet recommendation: diabetic diet  Wt Readings from Last 3 Encounters:  04/06/15 98.612 kg (217 lb 6.4 oz)    History of present illness:   Kelsey Stephens is a 70 y.o. female with a history of Atrial Fibrillation on Coumadin Rx, PAH, HTN, DM2 and Hypothyroid who presents to the ED with complaints of worsening SOB, Cough, and Fevers and Chills over a 1 week period. She was traveling by car to [place] from [place] to visit family and was brought to the ED today. She was evaluated in the ED for Sepsis initially then she was found to have a RML infiltrate and was referred for admission for a CAP Pneumonia. She was placed on IV Levaquin .   Hospital Course:   Acute hypoxic respiratory failure secondary to community-acquired pneumonia.  She was initially started on levofloxacin, however, due to her elevated INR, she was changed to doxycycline instead.  Her flu PCR and S. pneumo antigen tests were negative.  At the time of discharge, her fevers had resolved and she was able to ambulate without supplemental oxygen maintaining O2 sat > 88%.    Chronic atrial fibrillation, rate controlled.  CHADs2vasc = 4, needs anticoagulation and has been fairly stable on her warfarin dose until this illness.  Her INR was 4.24 on 2/8, then 5.61 on 2/9.   She was given one dose of vitamin K on 2/9 and her INR trended down to 2.76.  She will need an INR check in 3-4 days at a local urgent care since she is from out of town.  She was advised to take coumadin  nightly until she gets her INR repeated early next week.    Acute on chronic back pain.  Likely musculoskeletal and improved somewhat with a heating pad and muscle rub, however, due to her elevated INR, she was also at risk for spontaneous retroperitoneal hemorrhage.  CT abd/pelvis demonstrated only mild left perinephric inflammation and no evidence of hemorrhage.  She occasionally has flares of her chronic back pain and the quality of this pain is the same and it could have been aggravated by the recent road trip to [facility] and sleeping on the hospital bed.  Advised her to talk to her primary care doctor about her pain if it does not improve with menthol rub, heating pad.  I have low suspicion for discitis and osteomyelitis because her pain improved with muscle rub cream, it is similar to her previous pain, and her blood cultures were negative.  I gave her a 10 tabs of percocet but advised that she cannot drive if she takes this medication.   Acute kidney injury, creatinine trended down slightly with IV fluids  Hypertension, mild hypotension.  Held lasix and aldactone and placed hold parameters for beta blocker.  Recommended that she resume these medications at discharge.    Pulmonary hypertension, stable, continue Revatio  Diabetes  mellitus type 2, stable.  Held her metformin and gave her SSI.  Her A1c was 7.  She resumed metformin at discharge.    Consultants:  None  Procedures:  Chest x-ray  Antibiotics:  Levofloxacin 2/8, ceftriaxone 2/9  Discharge Exam: Filed Vitals:   04/08/15 1000 04/08/15 1433  BP: 117/65 120/48  Pulse: 95 82  Temp: 98.5 F (36.9 C) 99.6 F (37.6 C)  Resp: 20 20   Filed Vitals:   04/08/15 0144 04/08/15 0541 04/08/15 1000 04/08/15 1433  BP: 125/70 120/57  117/65 120/48  Pulse: 102 90 95 82  Temp: 98.9 F (37.2 C) 99.5 F (37.5 C) 98.5 F (36.9 C) 99.6 F (37.6 C)  TempSrc: Oral Oral Oral Oral  Resp: 20 20 20 20   Height:      Weight:      SpO2: 95% 98% 94% 96%     General: adult female, No acute distress, on RA  HEENT: NCAT, MMM  Cardiovascular: RRR, nl S1, S2 no mrg, 2+ pulses, warm extremities  Resp: Diminished in the right upper lobe, no rhonchi or wheeze, no increased WOB  Abdomen: NABS, soft, NT/ND  MSK: Normal tone and bulk, no LEE  Neuro: Grossly intact  Discharge Instructions      Discharge Instructions    Call MD for:  difficulty breathing, headache or visual disturbances    Complete by:  As directed      Call MD for:  extreme fatigue    Complete by:  As directed      Call MD for:  hives    Complete by:  As directed      Call MD for:  persistant dizziness or light-headedness    Complete by:  As directed      Call MD for:  persistant nausea and vomiting    Complete by:  As directed      Call MD for:  severe uncontrolled pain    Complete by:  As directed      Call MD for:  temperature >100.4    Complete by:  As directed      Diet - low sodium heart healthy    Complete by:  As directed      Discharge instructions    Complete by:  As directed   Please take warfarin 5mg  nightly until you have your INR checked again on Monday or Tuesday.     Driving Restrictions    Complete by:  As directed   No driving or operating heavy machinery while taking narcotic pain medication such as oxycodone.     Increase activity slowly    Complete by:  As directed             Medication List    TAKE these medications        ARMOUR THYROID 60 MG tablet  Generic drug:  thyroid  Take 60 mg by mouth daily before breakfast.     cefUROXime 500 MG tablet  Commonly known as:  CEFTIN  Take 1 tablet (500 mg total) by mouth 2 (two) times daily with a meal.     Fish Oil 1000 MG Caps  Take 1 capsule by mouth daily.      furosemide 20 MG tablet  Commonly known as:  LASIX  Take 20 mg by mouth 2 (two) times daily.     metFORMIN 500 MG tablet  Commonly known as:  GLUCOPHAGE  Take 500 mg by mouth 3 (three) times daily.  metoprolol tartrate 25 MG tablet  Commonly known as:  LOPRESSOR  Take 12.5 mg by mouth 2 (two) times daily.     MUSCLE RUB 10-15 % Crea  Apply 1 application topically as needed for muscle pain.     oxyCODONE-acetaminophen 5-325 MG tablet  Commonly known as:  PERCOCET/ROXICET  Take 1 tablet by mouth every 4 (four) hours as needed for severe pain.     REVATIO 20 MG tablet  Generic drug:  sildenafil  Take 20 mg by mouth 3 (three) times daily.     spironolactone 25 MG tablet  Commonly known as:  ALDACTONE  Take 12.5 mg by mouth daily.     Vitamin D-3 1000 units Caps  Take 5,000 Units by mouth daily.     warfarin 5 MG tablet  Commonly known as:  COUMADIN  Take 5-7.5 mg by mouth at bedtime. Take 5 mg daily except on Monday take 7.5 mg.       Follow-up Information    Follow up with INR check  On 04/11/2015.   Why:  or on Tuesday.        Follow up with Primary care doctor in [place]. Schedule an appointment as soon as possible for a visit in 1 week.       The results of significant diagnostics from this hospitalization (including imaging, microbiology, ancillary and laboratory) are listed below for reference.    Significant Diagnostic Studies: Ct Abdomen Pelvis Wo Contrast  04/08/2015  CLINICAL DATA:  70 year old female with severe back pain and elevated INR. Evaluate for retroperitoneal/ abdominal bleed. EXAM: CT ABDOMEN AND PELVIS WITHOUT CONTRAST TECHNIQUE: Multidetector CT imaging of the abdomen and pelvis was performed following the standard protocol without IV contrast. COMPARISON:  None. FINDINGS: Please note that parenchymal abnormalities may be missed without intravenous contrast. Lower chest: A small right pleural effusion and bibasilar atelectasis identified.  Hepatobiliary: The liver is unremarkable. Cholelithiasis identified without CT evidence of acute cholecystitis. Pancreas: Unremarkable Spleen: Unremarkable Adrenals/Urinary Tract: A severely atrophic right kidney is noted. Mild left perinephric stranding/inflammation is noted of uncertain chronicity. There is no evidence of obstructing urinary calculi. The adrenal glands and bladder are unremarkable. Stomach/Bowel: There is no evidence of bowel obstruction or definite bowel wall thickening. The appendix is normal. Vascular/Lymphatic: Mild aortic atherosclerotic calcifications noted without aneurysm. No enlarged lymph nodes are identified. Reproductive: The uterus and adnexal regions are within normal limits. Other: No free fluid, pneumoperitoneum or focal collection. Musculoskeletal: No acute or suspicious abnormalities identified. Mild degenerative changes within the lumbar spine identified. IMPRESSION: Mild left perinephric stranding/ inflammation of uncertain chronicity, and may be related to a chronic process or acute inflammation/infection. Correlate clinically. No evidence of obstructing urinary calculus. No evidence of abdominal/ pelvic/ retroperitoneal hematoma. Cholelithiasis. Small right pleural effusion and bibasilar atelectasis. Severely atrophic right kidney. Electronically Signed   By: Harmon Pier M.D.   On: 04/08/2015 16:43   Dg Chest 2 View  04/06/2015  CLINICAL DATA:  Fever, shortness of breath, cough, chest discomfort EXAM: CHEST  2 VIEW COMPARISON:  None. FINDINGS: Mild patchy right lower lung opacity, overlying the heart on the lateral view, suspicious for right middle lobe pneumonia. In the absence of prior imaging, chronic scarring is also possible. No pleural effusion or pneumothorax. Cardiomegaly.  Median sternotomy. Degenerative changes of the visualized thoracolumbar spine. IMPRESSION: Mild patchy right middle lobe opacity, suspicious for pneumonia, although chronic scarring is also  possible. Electronically Signed   By: Roselie Awkward.D.  On: 04/06/2015 17:29    Microbiology: Recent Results (from the past 240 hour(s))  Culture, blood (routine x 2)     Status: None (Preliminary result)   Collection Time: 04/06/15  4:44 PM  Result Value Ref Range Status   Specimen Description BLOOD RIGHT ANTECUBITAL  Final   Special Requests BOTTLES DRAWN AEROBIC AND ANAEROBIC 5CC  Final   Culture   Final    NO GROWTH 2 DAYS Performed at [facility]    Report Status PENDING  Incomplete  Culture, blood (routine x 2)     Status: None (Preliminary result)   Collection Time: 04/06/15  4:44 PM  Result Value Ref Range Status   Specimen Description BLOOD LEFT FOREARM  Final   Special Requests BOTTLES DRAWN AEROBIC ONLY 5CC  Final   Culture   Final    NO GROWTH 2 DAYS Performed at [facility]    Report Status PENDING  Incomplete  Urine culture     Status: None   Collection Time: 04/06/15  6:02 PM  Result Value Ref Range Status   Specimen Description URINE, CLEAN CATCH  Final   Special Requests NONE  Final   Culture   Final    MULTIPLE SPECIES PRESENT, SUGGEST RECOLLECTION Performed at [facility]    Report Status 04/08/2015 FINAL  Final     Labs: Basic Metabolic Panel:  Recent Labs Lab 04/06/15 1646 04/07/15 0459 04/08/15 0510  NA 130* 135 136  K 4.1 3.9 4.4  CL 91* 100* 100*  CO2 29 28 28   GLUCOSE 217* 132* 190*  BUN 29* 25* 24*  CREATININE 1.41* 1.26* 1.17*  CALCIUM 9.0 8.5* 9.3   Liver Function Tests:  Recent Labs Lab 04/06/15 1646  AST 25  ALT 21  ALKPHOS 58  BILITOT 0.4  PROT 7.2  ALBUMIN 3.5    Recent Labs Lab 04/06/15 1644  LIPASE 49   No results for input(s): AMMONIA in the last 168 hours. CBC:  Recent Labs Lab 04/06/15 1644 04/07/15 0459 04/08/15 0510  WBC 8.2 7.5 7.0  HGB 12.4 10.9* 11.2*  HCT 39.4 34.9* 36.4  MCV 85.8 86.0 87.5  PLT 168 166 180   Cardiac Enzymes: No results for input(s):  CKTOTAL, CKMB, CKMBINDEX, TROPONINI in the last 168 hours. BNP: BNP (last 3 results) No results for input(s): BNP in the last 8760 hours.  ProBNP (last 3 results) No results for input(s): PROBNP in the last 8760 hours.  CBG:  Recent Labs Lab 04/07/15 1159 04/07/15 1610 04/07/15 2100 04/08/15 0825 04/08/15 1230  GLUCAP 179* 104* 163* 160* 193*    Time coordinating discharge: 35 minutes  Signed:  Graden Hoshino  [facility] 04/08/2015, 5:18 PM

## 2015-04-08 NOTE — Progress Notes (Signed)
ANTICOAGULATION CONSULT NOTE - Follow Up Consult  Pharmacy Consult for Warfarin Indication: atrial fibrillation  Allergies  Allergen Reactions  . Ampicillin Rash    Pt reports swelling of fingers and rash between fingers.    Patient Measurements: Height: 5' (152.4 cm) Weight: 217 lb 6.4 oz (98.612 kg) IBW/kg (Calculated) : 45.5  Vital Signs: Temp: 98.5 F (36.9 C) (02/10 1000) Temp Source: Oral (02/10 1000) BP: 117/65 mmHg (02/10 1000) Pulse Rate: 95 (02/10 1000)  Labs:  Recent Labs  04/06/15 1644 04/06/15 1646 04/07/15 0459 04/08/15 0510  HGB 12.4  --  10.9* 11.2*  HCT 39.4  --  34.9* 36.4  PLT 168  --  166 180  LABPROT 38.5*  --  47.5* 27.9*  INR 4.24*  --  5.61* 2.76*  CREATININE  --  1.41* 1.26* 1.17*    Estimated Creatinine Clearance: 47.1 mL/min (by C-G formula based on Cr of 1.17).  Medical History: Past Medical History  Diagnosis Date  . Hypertension   . Atrial fibrillation (HCC)   . Diabetes mellitus without complication (HCC)   . Pulmonary hypertension [facility])     Assessment: 70 y.o. female admitted on 04/06/2015 with sepsis, CAP.  PMH also includes chronic warfarin anticoagulation for Afib.  Her INR on admission was elevated and increased further following Levaquin dose on 2/8.  Today, INR back into therapeutic range after Vitamin K administration 2/9.    Home dose is reported as  daily except 7.5mg  on Mondays, last taken on 2/7.  Today, 04/08/2015:  INR 2.76  CBC: Hgb 11.2, Plt 180  Diet: heart healthy, carb modified.  Drug-drug interactions:  Levaquin (one dose given 2/8) will increase INR; antibiotic now changed to ceftriaxone which is less likely to cause warfarin interaction.  Vitamin K 2.5mg  PO once 2/9.    Goal of Therapy:  INR 2-3 Monitor platelets by anticoagulation protocol: Yes   Plan:  - Resume warfarin today with 5 mg dose. - Daily INR.  Clance Boll, PharmD, BCPS Pager: [phone] 04/08/2015 10:33 AM

## 2015-04-08 NOTE — Progress Notes (Signed)
SATURATION QUALIFICATIONS: (This note is used to comply with regulatory documentation for home oxygen)  Patient Saturations on Room Air at Rest = 92  Patient Saturations on Room Air while Ambulating = 90  Patient Saturations on Liters of oxygen while Ambulating = Room Air  Please briefly explain why patient needs home oxygen:

## 2015-04-08 NOTE — Progress Notes (Signed)
Received a referral to have patient followed at coumadin clinic in town. Patient has PCP, from out of town, will need to follow up with urgent care if needs labs prior to returning home. Unable to arrange f/u unless patient followed by cardiologist associated with clinic.

## 2015-04-11 LAB — CULTURE, BLOOD (ROUTINE X 2)
CULTURE: NO GROWTH
Culture: NO GROWTH

## 2017-02-05 IMAGING — CT CT ABD-PELV W/O CM
2 of 4 series · 17 of 46 positions shown, 19 images · non-contrast
Comparison: None.

CLINICAL DATA: 69-year-old female with severe back pain and
elevated INR. Evaluate for retroperitoneal/ abdominal bleed.

EXAM:
CT ABDOMEN AND PELVIS WITHOUT CONTRAST
TECHNIQUE: Multidetector CT imaging of the abdomen and pelvis was performed
following the standard protocol without IV contrast.

[Series 2: rtn a/p w/o · axial · non-contrast · 0.81mm/px · z∈[-489,-129]mm · 14 of 80 slices shown, 16 images]
[im 4/80  soft-tissue]
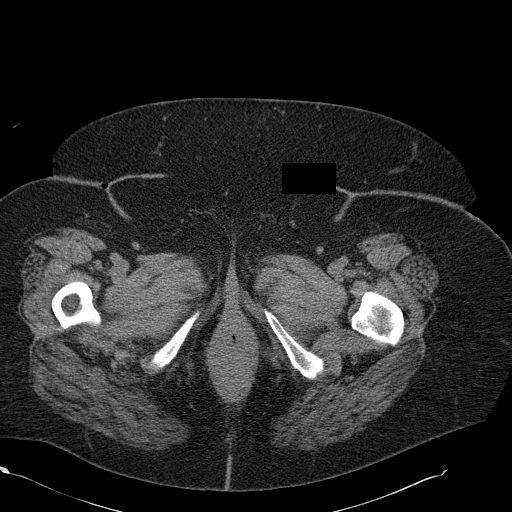
[im 4/80  bone]
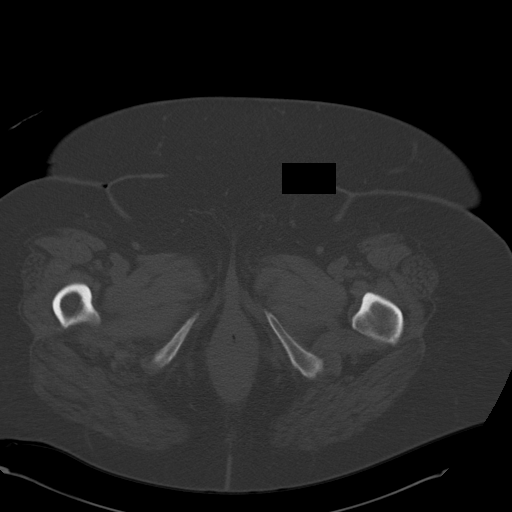
[im 10/80  soft-tissue]
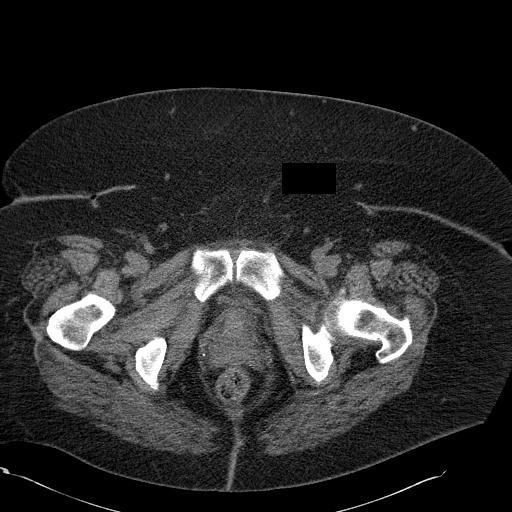
[im 16/80  soft-tissue]
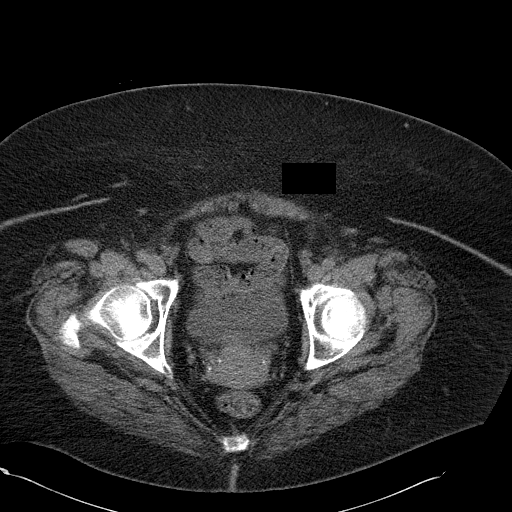
[im 22/80  soft-tissue]
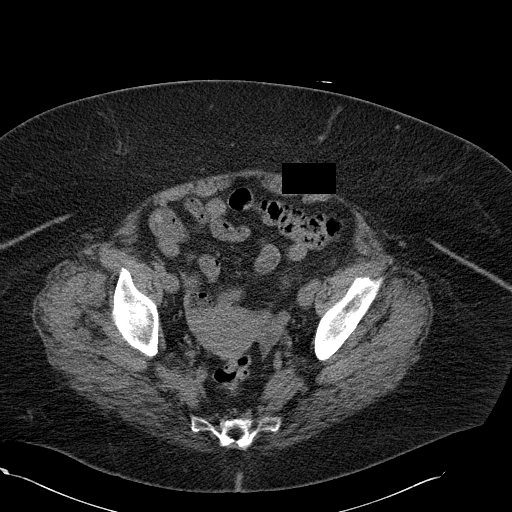
[im 28/80  soft-tissue]
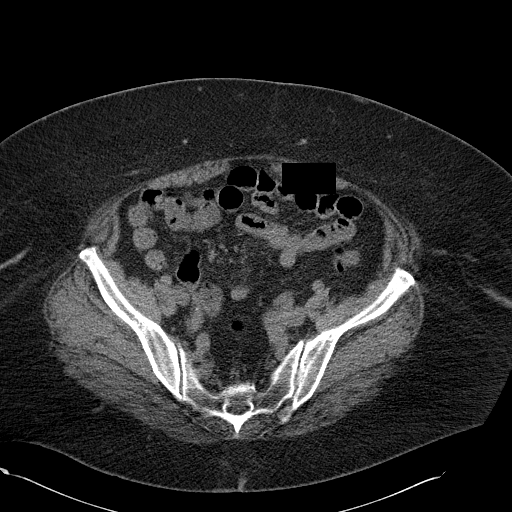
[im 31/80  soft-tissue]
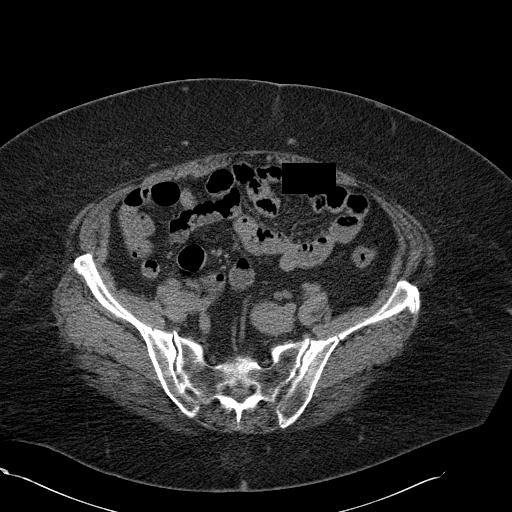
[im 37/80  soft-tissue]
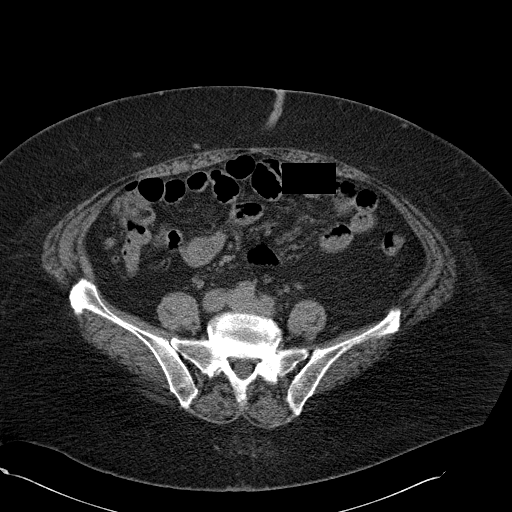
[im 43/80  soft-tissue]
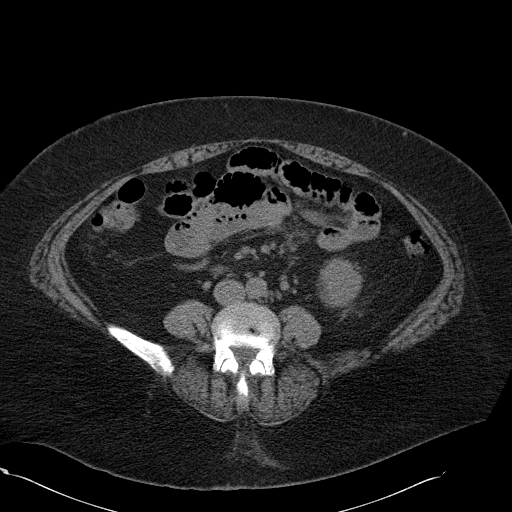
[im 49/80  soft-tissue]
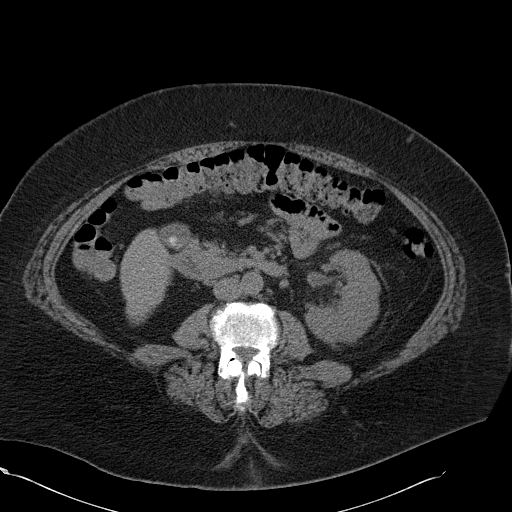
[im 49/80  bone]
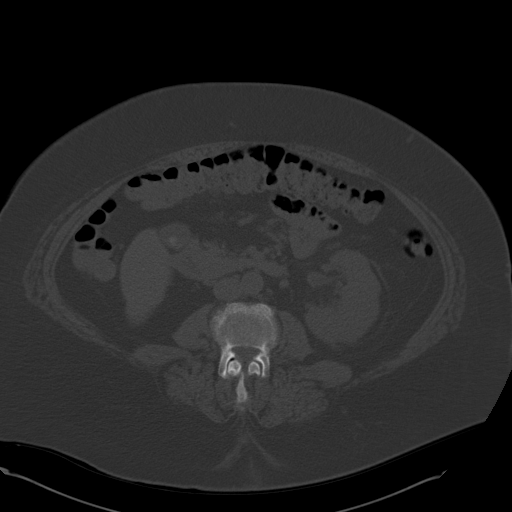
[im 52/80  soft-tissue]
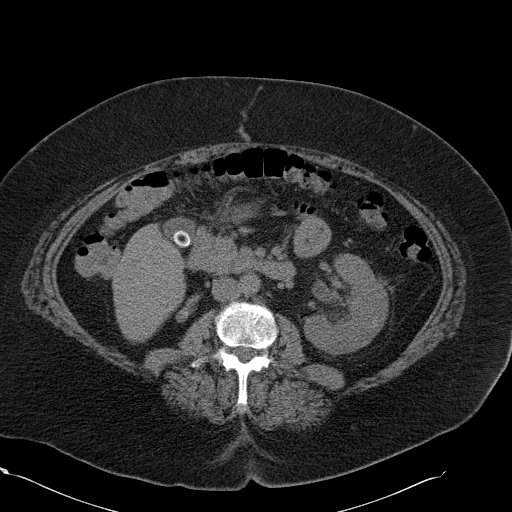
[im 58/80  soft-tissue]
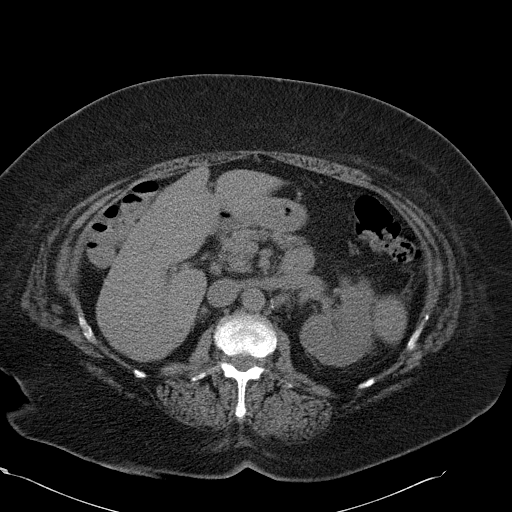
[im 64/80  soft-tissue]
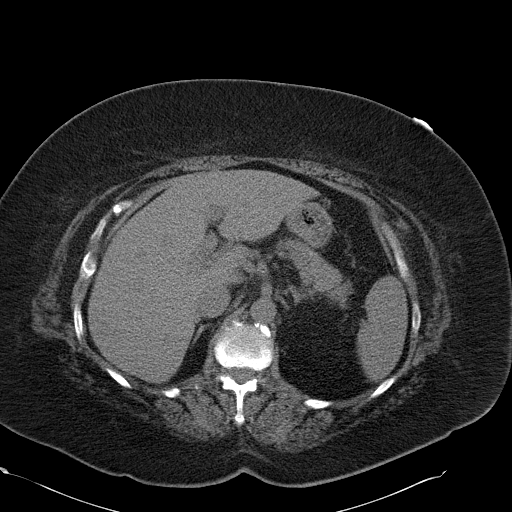
[im 70/80  soft-tissue]
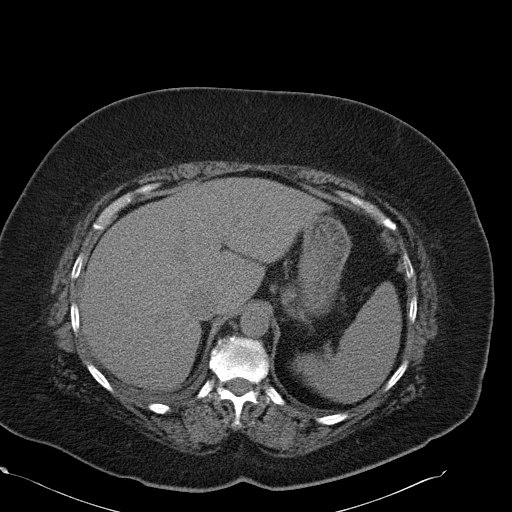
[im 76/80  soft-tissue]
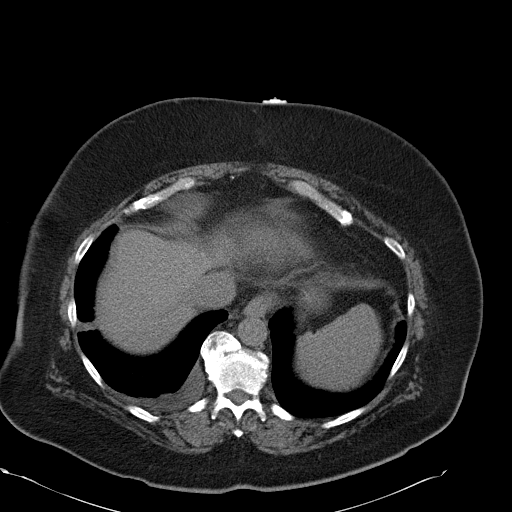

[Series 602: <mpr thick range> · coronal · 0.81mm/px · 3 of 101 slices shown]
[im 34/101  soft-tissue]
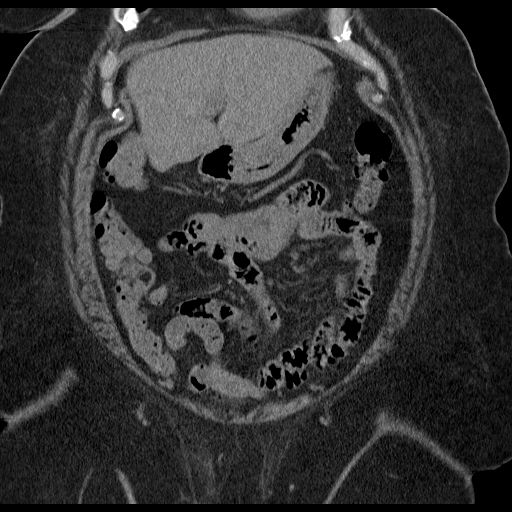
[im 45/101  soft-tissue]
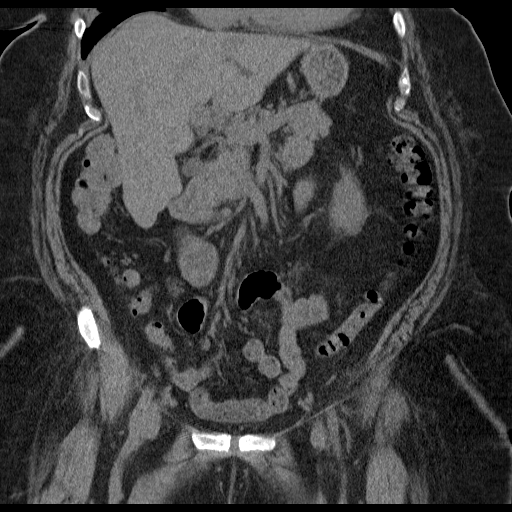
[im 56/101  soft-tissue]
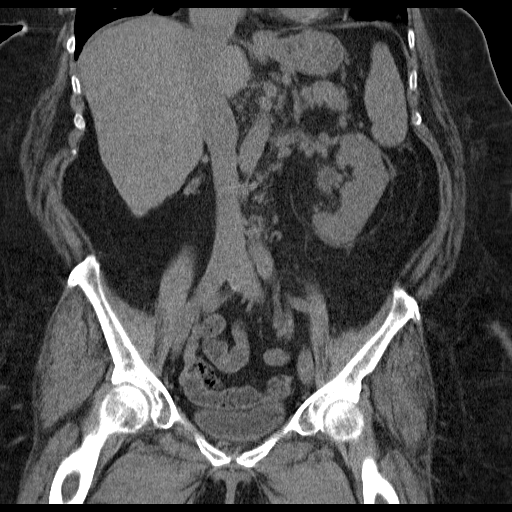

[17 of 46 positions shown; findings below may reference images not displayed]

FINDINGS: Please note that parenchymal abnormalities may be missed without
intravenous contrast.

Lower chest: A small right pleural effusion and bibasilar
atelectasis identified.

Hepatobiliary: The liver is unremarkable. Cholelithiasis identified
without CT evidence of acute cholecystitis.

Pancreas: Unremarkable

Spleen: Unremarkable

Adrenals/Urinary Tract: A severely atrophic right kidney is noted.
Mild left perinephric stranding/inflammation is noted of uncertain
chronicity. There is no evidence of obstructing urinary calculi. The
adrenal glands and bladder are unremarkable.

Stomach/Bowel: There is no evidence of bowel obstruction or definite
bowel wall thickening. The appendix is normal.

Vascular/Lymphatic: Mild aortic atherosclerotic calcifications noted
without aneurysm. No enlarged lymph nodes are identified.

Reproductive: The uterus and adnexal regions are within normal
limits.

Other: No free fluid, pneumoperitoneum or focal collection.

Musculoskeletal: No acute or suspicious abnormalities identified.
Mild degenerative changes within the lumbar spine identified.
IMPRESSION: Mild left perinephric stranding/ inflammation of uncertain
chronicity, and may be related to a chronic process or acute
inflammation/infection. Correlate clinically. No evidence of
obstructing urinary calculus.

No evidence of abdominal/ pelvic/ retroperitoneal hematoma.

Cholelithiasis.

Small right pleural effusion and bibasilar atelectasis.

Severely atrophic right kidney.

## 2020-12-13 ENCOUNTER — Ambulatory Visit: Payer: Medicare (Managed Care)
